# Patient Record
Sex: Male | Born: 1970 | Hispanic: Yes | Marital: Married | Smoking: Never smoker
Health system: Southern US, Community
[De-identification: ages and names within clinical notes are randomized; demographics above are authoritative.]

---

## 2019-10-29 ENCOUNTER — Encounter (HOSPITAL_COMMUNITY): Payer: Self-pay

## 2019-10-29 ENCOUNTER — Other Ambulatory Visit: Payer: Self-pay

## 2019-10-29 ENCOUNTER — Ambulatory Visit (HOSPITAL_COMMUNITY)
Admission: EM | Admit: 2019-10-29 | Discharge: 2019-10-29 | Disposition: A | Discharge status: Home / Self Care | Payer: Self-pay | Attending: Urgent Care | Admitting: Urgent Care

## 2019-10-29 ENCOUNTER — Ambulatory Visit (INDEPENDENT_AMBULATORY_CARE_PROVIDER_SITE_OTHER): Payer: Self-pay

## 2019-10-29 DIAGNOSIS — M79641 Pain in right hand: Secondary | ICD-10-CM

## 2019-10-29 DIAGNOSIS — M545 Low back pain, unspecified: Secondary | ICD-10-CM

## 2019-10-29 DIAGNOSIS — R0781 Pleurodynia: Secondary | ICD-10-CM

## 2019-10-29 DIAGNOSIS — M546 Pain in thoracic spine: Secondary | ICD-10-CM

## 2019-10-29 DIAGNOSIS — M25561 Pain in right knee: Secondary | ICD-10-CM

## 2019-10-29 DIAGNOSIS — M2550 Pain in unspecified joint: Secondary | ICD-10-CM

## 2019-10-29 MED ORDER — MELOXICAM 15 MG PO TABS: 15.0000 mg | ORAL_TABLET | Freq: Every day | ORAL | 0 refills | Status: AC

## 2019-10-29 MED ORDER — TIZANIDINE HCL 4 MG PO TABS: 4.0000 mg | ORAL_TABLET | Freq: Three times a day (TID) | ORAL | 0 refills | Status: AC | PRN
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
## 2021-07-10 ENCOUNTER — Emergency Department (HOSPITAL_COMMUNITY)
Admission: EM | Admit: 2021-07-10 | Discharge: 2021-07-10 | Disposition: A | Discharge status: Home / Self Care | Payer: Self-pay | Attending: Emergency Medicine | Admitting: Emergency Medicine

## 2021-07-10 ENCOUNTER — Other Ambulatory Visit: Payer: Self-pay

## 2021-07-10 DIAGNOSIS — L219 Seborrheic dermatitis, unspecified: Secondary | ICD-10-CM

## 2021-07-10 DIAGNOSIS — R7309 Other abnormal glucose: Secondary | ICD-10-CM | POA: Insufficient documentation

## 2021-07-10 LAB — CBG MONITORING, ED: Glucose-Capillary: 118 mg/dL — ABNORMAL HIGH (ref 70–99)

## 2021-07-10 MED ORDER — KETOCONAZOLE 2 % EX CREA: 1.0000 "application " | TOPICAL_CREAM | Freq: Every day | CUTANEOUS | 0 refills | Status: AC

## 2021-07-10 MED ORDER — KETOCONAZOLE 1 % EX SHAM: MEDICATED_SHAMPOO | CUTANEOUS | 0 refills | Status: AC

## 2021-07-10 NOTE — ED Triage Notes (Signed)
Pt./translator stated, Dakota Mccoy had some under arm pain for 2 weeks. Went to Dr. Given cream and medication and its gotten bigger and worse.. Dark spots under left arm.

## 2021-07-10 NOTE — Discharge Instructions (Signed)
Your rash is caused by seborrheic dermatitis, this is an inflammatory condition caused by a fungus.  Continue applying the triamcinolone ointment prescribed by your regular doctor and begin applying ketoconazole ointment once daily and then washing the area with the prescribed ketoconazole shampoo once daily after work.  If the area still not improving please follow-up with your regular doctor.  ----  Su erupcin es causada por dermatitis seborreica, esta es una condicin inflamatoria causada por un hongo. Contine aplicando el ungento de triamcinolona recetado por su mdico habitual y comience a Magazine features editor ungento de ketoconazol una vez al da y luego lave el rea con el champ de ketoconazol recetado una vez al da despus del [facility]. Si el rea an no mejora, haga un seguimiento con su mdico habitual.

## 2021-07-10 NOTE — ED Provider Notes (Signed)
[facility] EMERGENCY DEPARTMENT Provider Note   CSN: [id number] Arrival date & time: 07/10/21  1159     History  Chief Complaint  Patient presents with   Rash    Dakota Mccoy is a 51 y.o. male.  Dakota Mccoy is a 51 y.o. male who is otherwise healthy, presents to the emergency department for evaluation of a rash.  Patient reports he has had a rash under his left arm that has been present for about 3 weeks.  He reports that it is itchy and gets inflamed and irritated throughout the workday and then it becomes painful to move his arm around.  He is also noted some dark spots on his inner upper arm over the past few days.  He saw a doctor for this 2 weeks ago and was prescribed triamcinolone and hydroxyzine and he reports that this is helped somewhat with the rash but it has not completely resolved.  He reports he has had to wear more layers at work and feels like this has made it worse as well.  No associated fevers or chills, nausea or vomiting.  He has not noticed a similar rash anywhere else.  No other aggravating or alleviating factors.  The history is provided by the patient. The history is limited by a language barrier. A language interpreter was used.      Home Medications Prior to Admission medications   Medication Sig Start Date End Date Taking? Authorizing Provider  ketoconazole (NIZORAL) 2 % cream Apply 1 application topically daily. 07/10/21  Yes Dartha Lodge, PA-C  KETOCONAZOLE, TOPICAL, 1 % SHAM Wash left underarm with ketoconazole shampoo once daily 07/10/21  Yes Marionette Meskill, Arva Chafe, PA-C  meloxicam (MOBIC) 15 MG tablet Take 1 tablet (15 mg total) by mouth daily. 10/29/19   Wallis Bamberg, PA-C  tiZANidine (ZANAFLEX) 4 MG tablet Take 1 tablet (4 mg total) by mouth every 8 (eight) hours as needed. 10/29/19   Wallis Bamberg, PA-C      Allergies    Patient has no known allergies.    Review of Systems   Review of Systems  Constitutional:  Negative for  chills and fever.  Skin:  Positive for rash.   Physical Exam Updated Vital Signs BP 114/82 (BP Location: Right Arm)    Pulse 62    Temp 98.4 F (36.9 C) (Oral)    Resp 16    SpO2 100%  Physical Exam Vitals and nursing note reviewed.  Constitutional:      General: He is not in acute distress.    Appearance: Normal appearance. He is well-developed. He is not ill-appearing or diaphoretic.  HENT:     Head: Normocephalic and atraumatic.  Eyes:     General:        Right eye: No discharge.        Left eye: No discharge.  Pulmonary:     Effort: Pulmonary effort is normal. No respiratory distress.  Musculoskeletal:     Cervical back: Neck supple.  Skin:    General: Skin is warm and dry.     Findings: Rash present.     Comments: Rash in the left axilla as pictured below.  With diffuse surrounding areas of hyperpigmentation on the upper arm.  Rash is raised with a scaly texture and some redness, no vesicles, pustules or petechiae noted.  No other similar rashes noted elsewhere.  Neurological:     Mental Status: He is alert and oriented to person, place, and time.  Coordination: Coordination normal.  Psychiatric:        Mood and Affect: Mood normal.        Behavior: Behavior normal.     ED Results / Procedures / Treatments   Labs (all labs ordered are listed, but only abnormal results are displayed) Labs Reviewed  CBG MONITORING, ED - Abnormal; Notable for the following components:      Result Value   Glucose-Capillary 118 (*)    All other components within normal limits    EKG None  Radiology No results found.  Procedures Procedures    Medications Ordered in ED Medications - No data to display  ED Course/ Medical Decision Making/ A&P                           51 year old male presents with rash under the left axilla that improved some with triamcinolone ointment over the past 2 weeks but has not completely resolved.  Patient with no systemic symptoms, he has  normal vitals and is well-appearing.  He does have some surrounding areas of hyperpigmentation.  Differential includes seborrheic dermatitis, contact dermatitis, psoriasis, eczema, acanthosis nigricans hands.  No signs of more systemic allergic reaction, no petechiae, pustules, vesicles or purpura.  CBG checks, minimally elevated at 118 but patient is not fasting, lower suspicion for diabetes and acanthosis nigricans.  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  In addition to triamcinolone ointment we will treat with ketoconazole cream as well as ketoconazole washes daily and encourage patient to follow closely with primary care provider or dermatology if symptoms or not improving.  I had extensive conversation with the patient using interpreter explaining all medications and recommendations and answered all questions.  Discussed return precautions and appropriate follow-up with patient and he expresses understanding and agreement with this plan.  Discharged home in good condition.        Final Clinical Impression(s) / ED Diagnoses Final diagnoses:  Seborrheic dermatitis    Rx / DC Orders ED Discharge Orders          Ordered    ketoconazole (NIZORAL) 2 % cream  Daily        07/10/21 1644    KETOCONAZOLE, TOPICAL, 1 % SHAM        07/10/21 1644              Dartha Lodge, [place] 07/11/21 1419    Franne Forts, DO 07/13/21 2354

## 2021-07-10 NOTE — ED Notes (Signed)
DC instructions reviewed with pt. PT verbalized understanding.  PT used google translate to gain clarity on any questions he had. PT DC

## 2021-07-10 NOTE — ED Provider Triage Note (Signed)
Emergency Medicine Provider Triage Evaluation Note  Dakota Mccoy , a 51 y.o. male  was evaluated in triage.  Pt complains of rash under his left armpit has had for several weeks.  Patient states he was initially seen by his provider who placed him on a cream and "pills", he finished these and said they overall helped but his symptoms have continued.  He also states that last night he started noticing some dark spots that he believes are bruises in the same place, and there was worsening pain and itching.  Review of Systems  Positive: Rash, itching, bruising Negative: Fever, chills  Physical Exam  BP 108/81 (BP Location: Right Arm)    Pulse 74    Temp 98.8 F (37.1 C) (Oral)    Resp 17    SpO2 97%  Gen:   Awake, no distress   Resp:  Normal effort  MSK:   Moves extremities without difficulty  Other:  Erythematous annular dry lesion underneath the left axilla, no overlying signs of cellulitis  Medical Decision Making  Medically screening exam initiated at 1:21 PM.  Appropriate orders placed.  Quintella Reichert was informed that the remainder of the evaluation will be completed by another provider, this initial triage assessment does not replace that evaluation, and the importance of remaining in the ED until their evaluation is complete.     Morgane Joerger T, PA-C 07/10/21 1322

## 2021-09-17 IMAGING — DX DG RIBS W/ CHEST 3+V*R*
3 series · 3 of 3 positions shown · non-contrast
Comparison: None.

CLINICAL DATA: Right lower rib pain

EXAM:
RIGHT RIBS AND CHEST - 3+ VIEW

[chest pa]
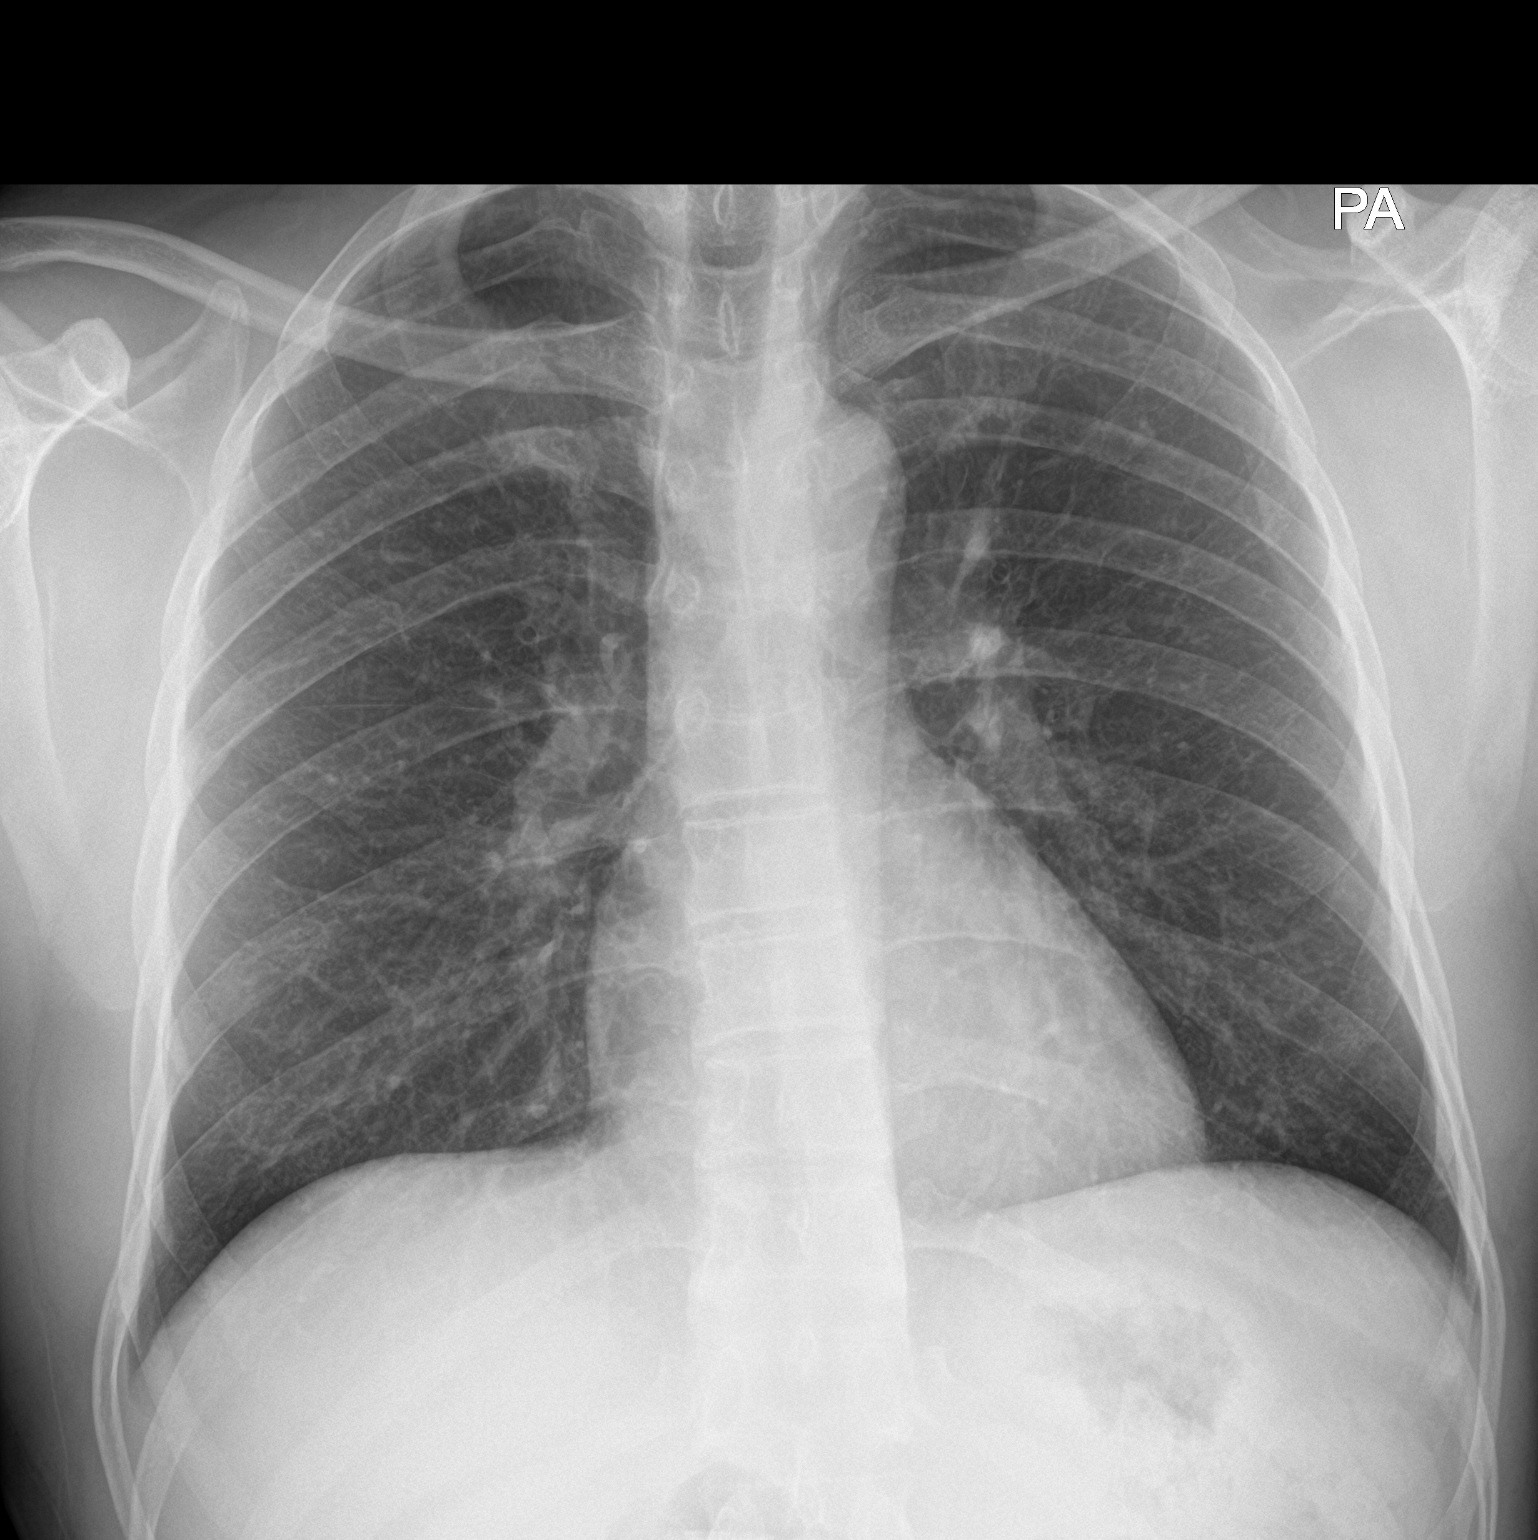

[rib pa]
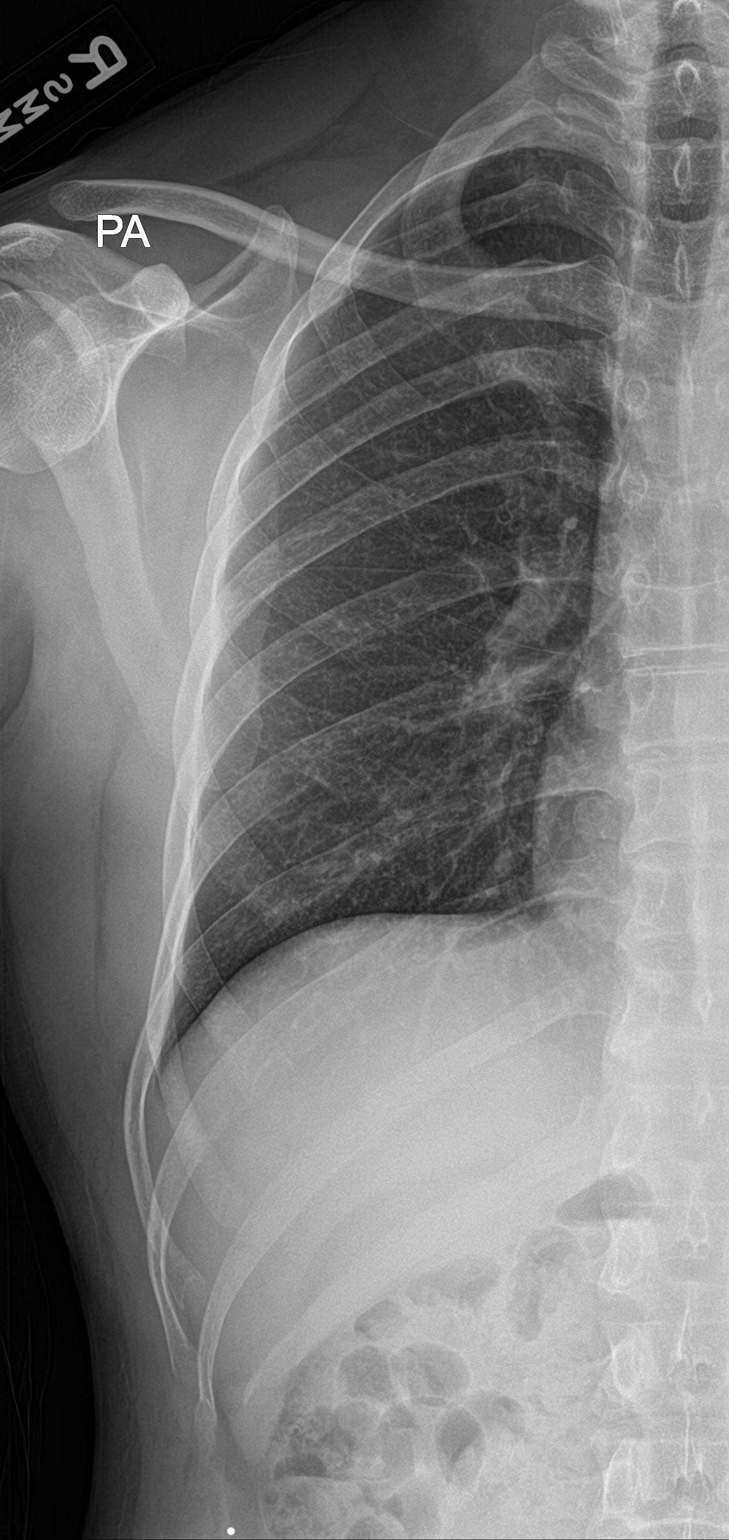

[rib obl]
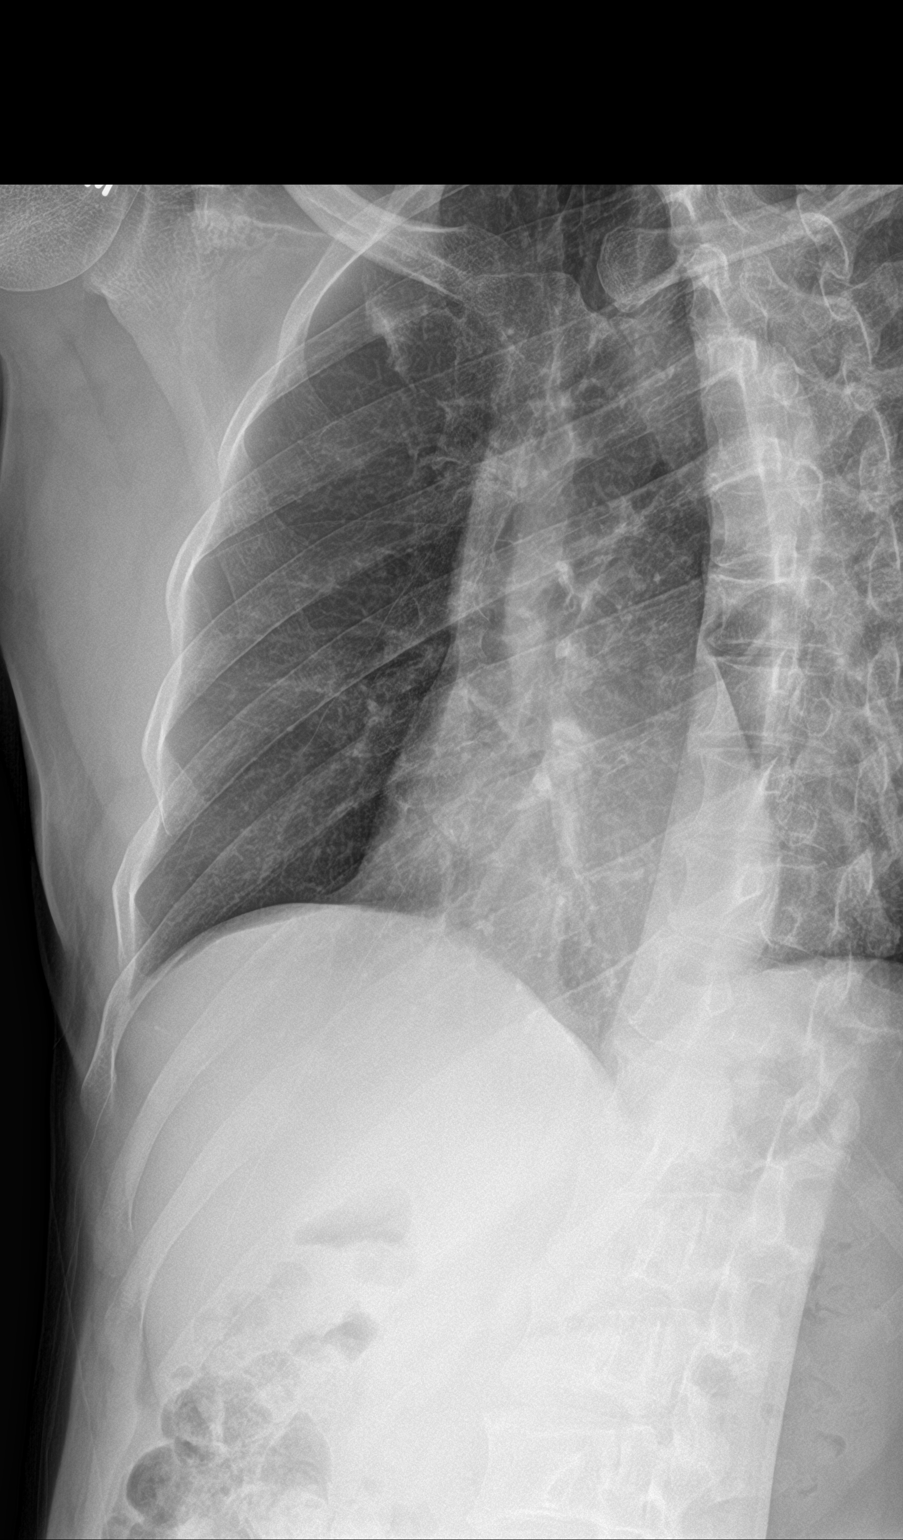

[3 of 3 positions shown; findings below may reference images not displayed]

FINDINGS: No fracture or other bone lesions are seen involving the ribs. There
is no evidence of pneumothorax or pleural effusion. Both lungs are
clear. Heart size and mediastinal contours are within normal limits.
IMPRESSION: Negative.
